# Patient Record
Sex: Male | Born: 2005 | Race: Black or African American | Hispanic: No | Marital: Single
Health system: Southern US, Community
[De-identification: ages and names within clinical notes are randomized; demographics above are authoritative.]

---
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
## 2024-06-14 ENCOUNTER — Other Ambulatory Visit: Payer: Self-pay | Admitting: Family Medicine

## 2024-06-14 DIAGNOSIS — R109 Unspecified abdominal pain: Secondary | ICD-10-CM

## 2024-06-22 ENCOUNTER — Other Ambulatory Visit
